# Patient Record
Sex: Female | Born: 1950 | Race: Black or African American | Hispanic: No | Smoking: Never smoker
Health system: Southern US, Community
[De-identification: ages and names within clinical notes are randomized; demographics above are authoritative.]

## PROBLEM LIST (undated history)

## (undated) DIAGNOSIS — K439 Ventral hernia without obstruction or gangrene: Secondary | ICD-10-CM

## (undated) DIAGNOSIS — E669 Obesity, unspecified: Secondary | ICD-10-CM

## (undated) DIAGNOSIS — E7800 Pure hypercholesterolemia, unspecified: Secondary | ICD-10-CM

## (undated) HISTORY — PX: CHOLECYSTECTOMY: SHX55

---

## 2003-10-11 ENCOUNTER — Emergency Department (HOSPITAL_COMMUNITY): Admission: EM | Admit: 2003-10-11 | Discharge: 2003-10-12 | Payer: Self-pay | Admitting: Emergency Medicine

## 2003-10-17 ENCOUNTER — Ambulatory Visit (HOSPITAL_BASED_OUTPATIENT_CLINIC_OR_DEPARTMENT_OTHER): Admission: RE | Admit: 2003-10-17 | Discharge: 2003-10-17 | Payer: Self-pay | Admitting: Orthopedic Surgery

## 2013-03-03 ENCOUNTER — Encounter (HOSPITAL_BASED_OUTPATIENT_CLINIC_OR_DEPARTMENT_OTHER): Payer: Self-pay | Admitting: *Deleted

## 2013-03-03 ENCOUNTER — Emergency Department (HOSPITAL_BASED_OUTPATIENT_CLINIC_OR_DEPARTMENT_OTHER)
Admission: EM | Admit: 2013-03-03 | Discharge: 2013-03-03 | Disposition: A | Discharge status: Home / Self Care | Payer: Federal, State, Local not specified - PPO | Attending: Emergency Medicine | Admitting: Emergency Medicine

## 2013-03-03 DIAGNOSIS — H109 Unspecified conjunctivitis: Secondary | ICD-10-CM | POA: Insufficient documentation

## 2013-03-03 MED ORDER — TETRACAINE HCL 0.5 % OP SOLN
OPHTHALMIC | Status: AC
  Administered 2013-03-03: 2 [drp]
  Filled 2013-03-03: qty 2

## 2013-03-03 MED ORDER — FLUORESCEIN SODIUM 1 MG OP STRP
ORAL_STRIP | OPHTHALMIC | Status: AC
  Administered 2013-03-03: 17:00:00
  Filled 2013-03-03: qty 1

## 2013-03-03 MED ORDER — OFLOXACIN 0.3 % OP SOLN: 1.0000 [drp] | Freq: Four times a day (QID) | OPHTHALMIC | Status: DC

## 2013-03-03 NOTE — ED Notes (Signed)
Left eye pain. States she may have scratched her eye.

## 2013-03-03 NOTE — ED Provider Notes (Signed)
   History    CSN: [id number] Arrival date & time 03/03/13  1528  First MD Initiated Contact with Patient 03/03/13 1601     Chief Complaint  Patient presents with  . Eye Pain   (Consider location/radiation/quality/duration/timing/severity/associated sxs/prior Treatment) HPI Comments: Patient a contact lens wearer.  Took her contacts out last night and unsure if she may have scratched it as it has burning since.    Patient is a 62 y.o. female presenting with eye pain. The history is provided by the patient.  Eye Pain This is a new problem. The current episode started yesterday. The problem occurs constantly. The problem has been gradually worsening. Pertinent negatives include no headaches. Nothing aggravates the symptoms. Nothing relieves the symptoms. She has tried nothing for the symptoms. The treatment provided no relief.   History reviewed. No pertinent past medical history. History reviewed. No pertinent past surgical history. No family history on file. History  Substance Use Topics  . Smoking status: Never Smoker   . Smokeless tobacco: Not on file  . Alcohol Use: No   OB History   Grav Para Term Preterm Abortions TAB SAB Ect Mult Living                 Review of Systems  Eyes: Positive for pain.  Neurological: Negative for headaches.  All other systems reviewed and are negative.    Allergies  Review of patient's allergies indicates no known allergies.  Home Medications  No current outpatient prescriptions on file. BP 150/83  Pulse 70  Temp(Src) 98.3 F (36.8 C) (Oral)  Resp 20  Ht 5\' 4"  (1.626 m)  Wt 240 lb (108.863 kg)  BMI 41.18 kg/m2  SpO2 96% Physical Exam  Nursing note and vitals reviewed. Constitutional: She is oriented to person, place, and time. She appears well-developed and well-nourished. No distress.  HENT:  Head: Normocephalic and atraumatic.  Eyes: Pupils are equal, round, and reactive to light.  The left conjunctiva is injected with clear  discharge.  There is no obvious corneal abrasion or ulcer to direct visualization or with fluorescein staining.    Neck: Normal range of motion. Neck supple.  Neurological: She is alert and oriented to person, place, and time.  Skin: Skin is warm and dry. She is not diaphoretic.    ED Course  Procedures (including critical care time) Labs Reviewed - No data to display No results found. No diagnosis found.  MDM  Will treat with ofloxin for contact lens conjunctivitis.  Return prn.  Geoffery Lyons, MD 03/03/13 [phone]

## 2013-07-03 ENCOUNTER — Other Ambulatory Visit: Payer: Self-pay

## 2013-07-03 DIAGNOSIS — Z1231 Encounter for screening mammogram for malignant neoplasm of breast: Secondary | ICD-10-CM

## 2013-07-31 ENCOUNTER — Ambulatory Visit: Payer: Federal, State, Local not specified - PPO

## 2013-08-03 ENCOUNTER — Emergency Department (HOSPITAL_BASED_OUTPATIENT_CLINIC_OR_DEPARTMENT_OTHER)
Admission: EM | Admit: 2013-08-03 | Discharge: 2013-08-03 | Disposition: A | Discharge status: Home / Self Care | Payer: Federal, State, Local not specified - PPO | Attending: Emergency Medicine | Admitting: Emergency Medicine

## 2013-08-03 ENCOUNTER — Emergency Department (HOSPITAL_BASED_OUTPATIENT_CLINIC_OR_DEPARTMENT_OTHER): Payer: Federal, State, Local not specified - PPO

## 2013-08-03 ENCOUNTER — Encounter (HOSPITAL_BASED_OUTPATIENT_CLINIC_OR_DEPARTMENT_OTHER): Payer: Self-pay | Admitting: Emergency Medicine

## 2013-08-03 DIAGNOSIS — J069 Acute upper respiratory infection, unspecified: Secondary | ICD-10-CM

## 2013-08-03 DIAGNOSIS — J029 Acute pharyngitis, unspecified: Secondary | ICD-10-CM | POA: Insufficient documentation

## 2013-08-03 DIAGNOSIS — R509 Fever, unspecified: Secondary | ICD-10-CM | POA: Insufficient documentation

## 2013-08-03 DIAGNOSIS — R51 Headache: Secondary | ICD-10-CM | POA: Insufficient documentation

## 2013-08-03 MED ORDER — IBUPROFEN 400 MG PO TABS
600.0000 mg | ORAL_TABLET | Freq: Once | ORAL | Status: AC
  Administered 2013-08-03: 600 mg via ORAL
  Filled 2013-08-03 (×2): qty 1

## 2013-08-03 MED ORDER — ALBUTEROL SULFATE (5 MG/ML) 0.5% IN NEBU
5.0000 mg | INHALATION_SOLUTION | Freq: Once | RESPIRATORY_TRACT | Status: AC
  Administered 2013-08-03: 5 mg via RESPIRATORY_TRACT
  Filled 2013-08-03: qty 1

## 2013-08-03 MED ORDER — ALBUTEROL SULFATE HFA 108 (90 BASE) MCG/ACT IN AERS: 2.0000 | INHALATION_SPRAY | RESPIRATORY_TRACT | Status: DC | PRN

## 2013-08-03 MED ORDER — OXYMETAZOLINE HCL 0.05 % NA SOLN
2.0000 | Freq: Once | NASAL | Status: AC
  Administered 2013-08-03: 2 via NASAL
  Filled 2013-08-03: qty 15

## 2013-08-03 NOTE — ED Notes (Signed)
Pt c/o sneezing runny nose, ha, congestion, wheezing, sorethroat cough onset 2 days ago

## 2013-08-03 NOTE — ED Provider Notes (Signed)
TIME SEEN: 8:31 PM This chart was scribed for Layla Maw Ward, DO by Clydene Laming, ED Scribe. This patient was seen in room MH01/MH01 and the patient's care was started at 8:31 PM. CHIEF COMPLAINT: Cough, Chills  HPI:  HPI Comments: Angela Sampson is a 62 y.o. female no significant past medical history who presents to the Emergency Department complaining of a fever with an associated productive cough, chills, mild diffuse headache, sore throat, rhinorrhea, and wheezing onset yesterday. Pt denies vomiting or diarrhea. Pt has had a flu shot this year. She states some of her co-workers have recently been ill with flu like symptoms. Pt denies any foreign traveling. Pt does not have a hx of asthma or COPD or tobacco use.   ROS: See HPI Constitutional:  fever  Eyes: no drainage  ENT: no runny nose   Cardiovascular:  no chest pain  Resp: no SOB GI: no vomiting GU: no dysuria Integumentary: no rash  Allergy: no hives  Musculoskeletal: no leg swelling  Neurological: no slurred speech ROS otherwise negative  PAST MEDICAL HISTORY/PAST SURGICAL HISTORY:  History reviewed. No pertinent past medical history.  MEDICATIONS:  Prior to Admission medications   Medication Sig Start Date End Date Taking? Authorizing Provider  ofloxacin (OCUFLOX) 0.3 % ophthalmic solution Place 1 drop into the left eye 4 (four) times daily. 03/03/13   Geoffery Lyons, MD    ALLERGIES:  No Known Allergies  SOCIAL HISTORY:  History  Substance Use Topics  . Smoking status: Never Smoker   . Smokeless tobacco: Not on file  . Alcohol Use: No    FAMILY HISTORY: No family history on file.  EXAM: Triage Vitals:BP 149/68  Pulse 83  Temp(Src) 98.5 F (36.9 C) (Oral)  Resp 18  Ht 5\' 4"  (1.626 m)  Wt 240 lb (108.863 kg)  BMI 41.18 kg/m2  SpO2 96% CONSTITUTIONAL: Alert and oriented and responds appropriately to questions. Well-appearing; well-nourished HEAD: Normocephalic EYES: Conjunctivae clear, PERRL ENT:  normal nose; no rhinorrhea; moist mucous membranes; mild pharyngeal erythema without tonsillar hypertrophy or exudate NECK: Supple, no meningismus, no LAD  CARD: RRR; S1 and S2 appreciated; no murmurs, no clicks, no rubs, no gallops RESP: Normal chest excursion without splinting or tachypnea; breath sounds equal bilaterally with mild expiratory wheeze, no rhonchi or rales ABD/GI: Normal bowel sounds; non-distended; soft, non-tender, no rebound, no guarding BACK:  The back appears normal and is non-tender to palpation, there is no CVA tenderness EXT: Normal ROM in all joints; non-tender to palpation; no edema; normal capillary refill; no cyanosis    SKIN: Normal color for age and race; warm NEURO: Moves all extremities equally PSYCH: The patient's mood and manner are appropriate. Grooming and personal hygiene are appropriate.  MEDICAL DECISION MAKING: Patient here with likely viral symptoms. She is hemodynamically stable. She has mild expiratory wheeze. We'll give albuterol and obtain chest x-ray. Anticipate discharge home.  ED PROGRESS: Chest x-ray shows no infiltrate. Patient reports feeling much better after albuterol and aspirin and ibuprofen. We'll discharge home with supportive care instructions, prescription for albuterol and return precautions. Discussed with patient that I feel this is likely a virus and antiemetics operative time. She has a primary care physician for followup. She verbalizes understanding and is comfortable plan.    Layla Maw Ward, DO 08/03/13 2141

## 2013-08-03 NOTE — ED Notes (Signed)
Patient transported to X-ray 

## 2013-08-03 NOTE — ED Notes (Signed)
Fever, chills, headache sore throat runny nose, and wheezing since yesterday.

## 2013-08-30 ENCOUNTER — Ambulatory Visit: Payer: Federal, State, Local not specified - PPO

## 2014-07-10 IMAGING — CR DG CHEST 2V
2 series · 2 of 2 positions shown · non-contrast
Comparison: None

CLINICAL DATA: Chills, cough, wheezing, question infiltrate

EXAM:
CHEST  2 VIEW

[w chest pa]
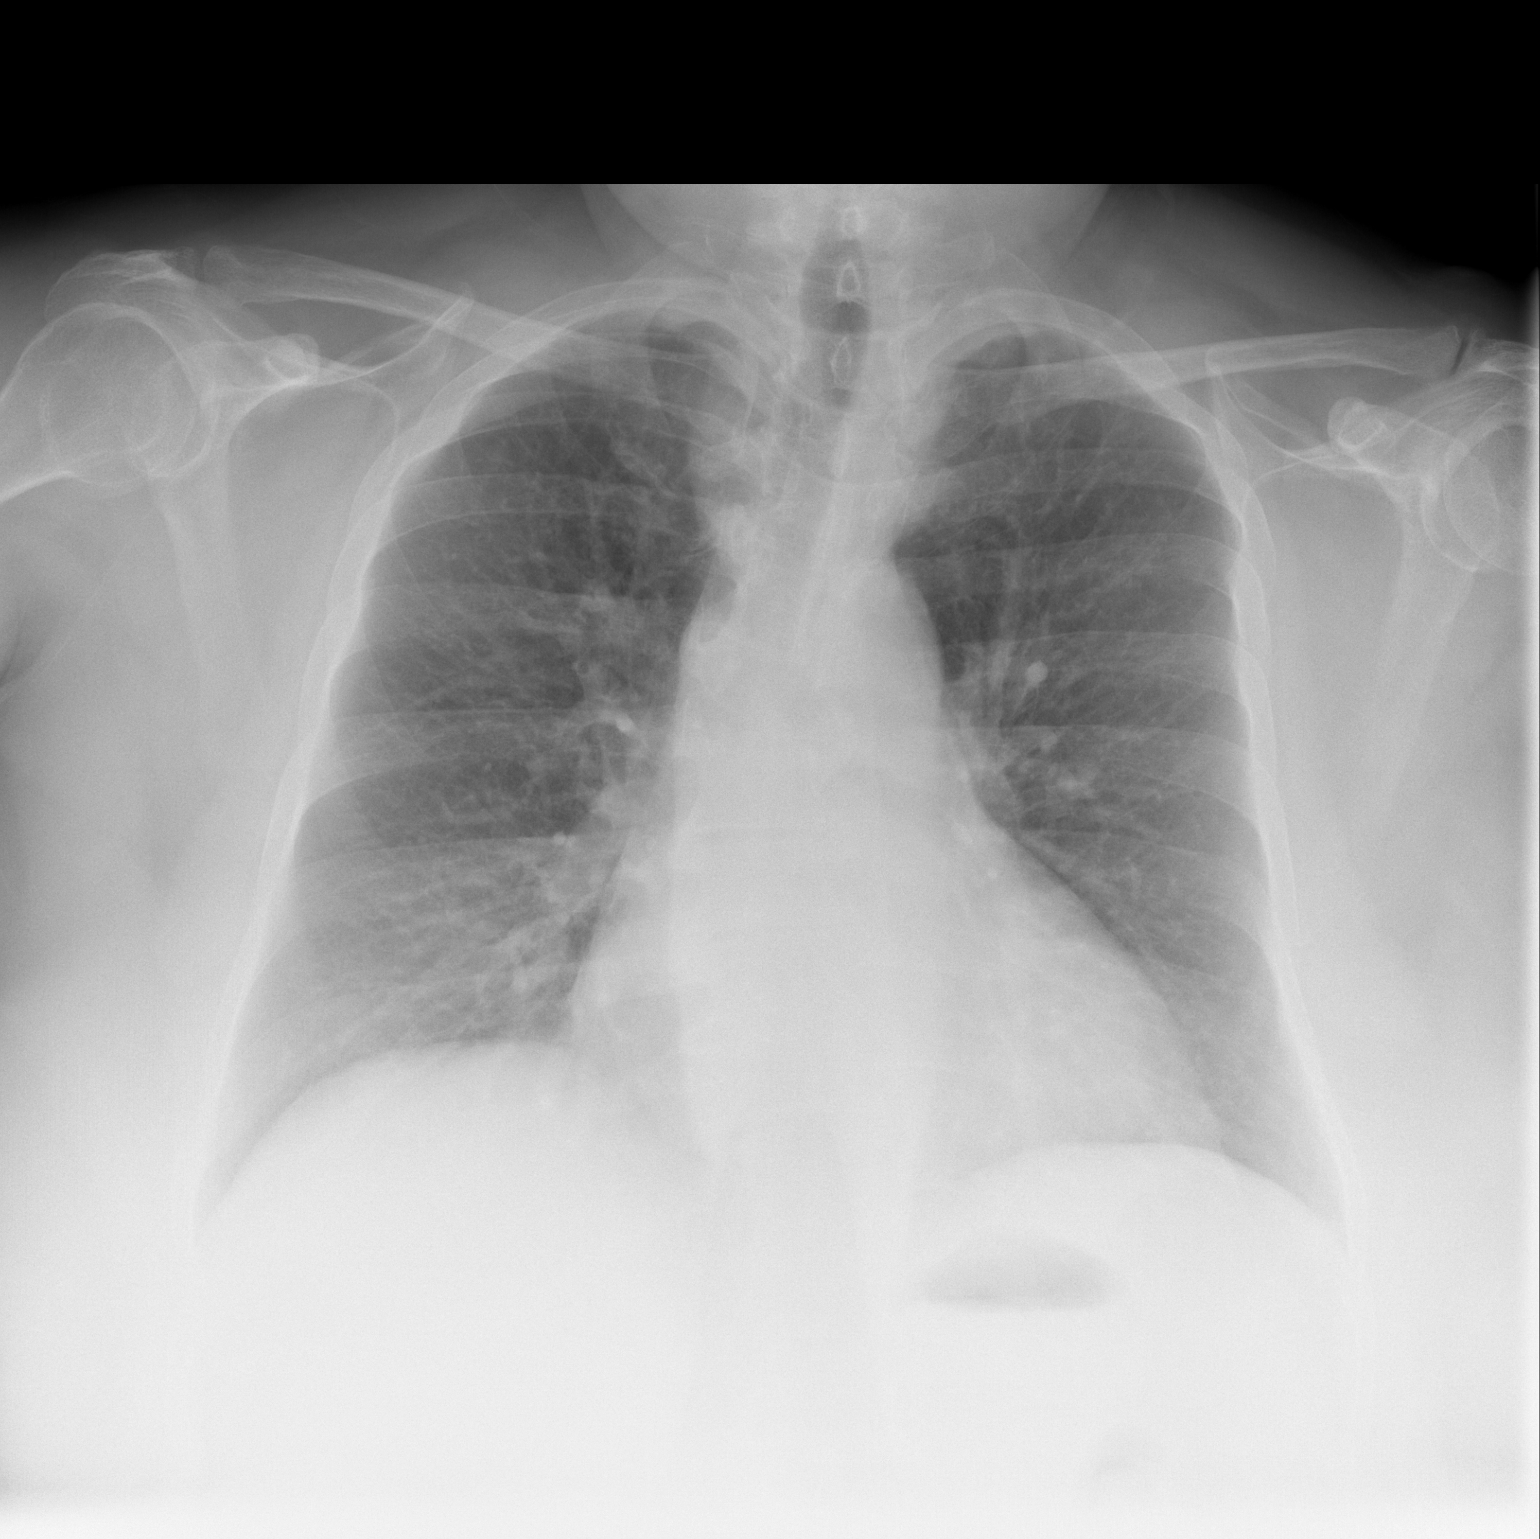

[w chest lat]
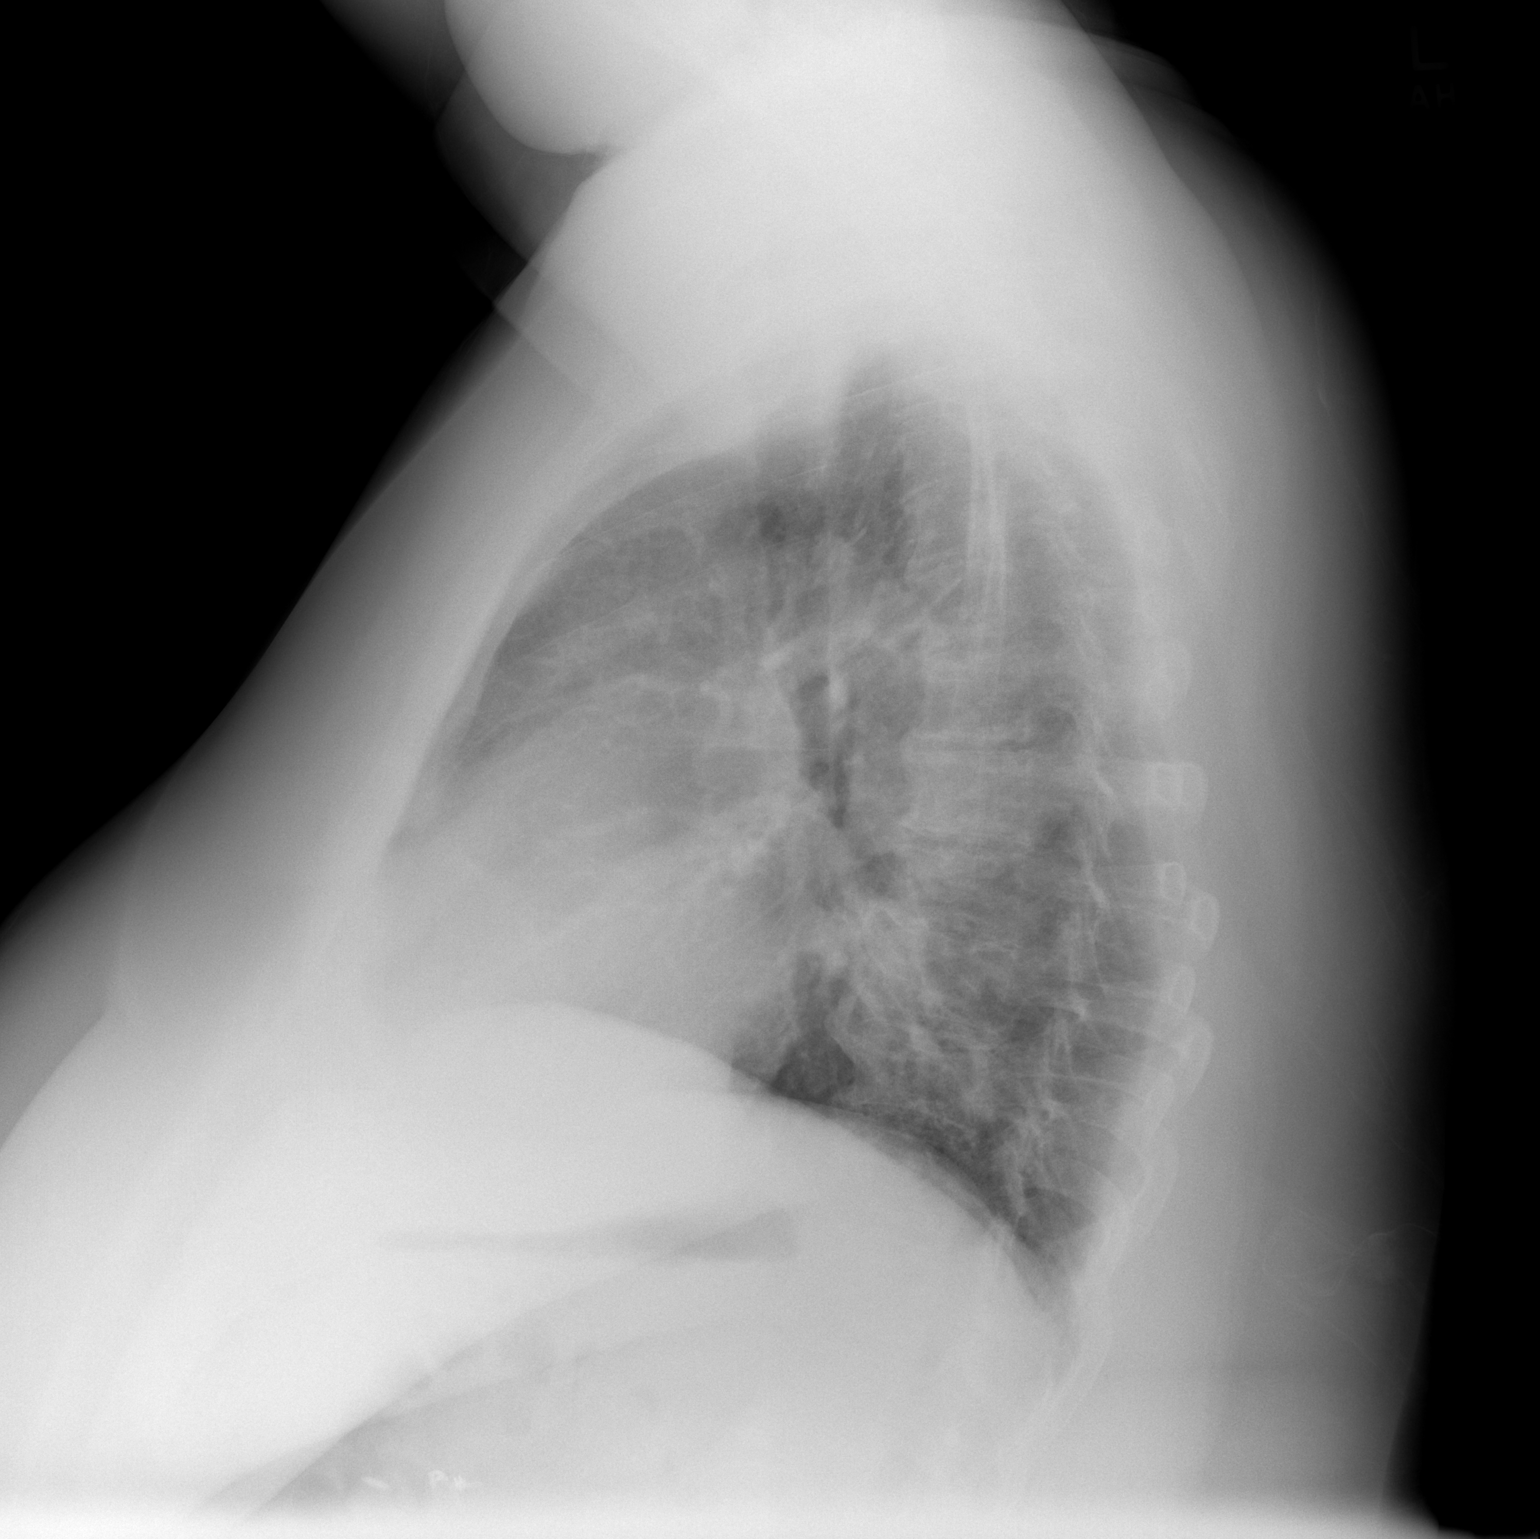

[2 of 2 positions shown; findings below may reference images not displayed]

FINDINGS: Enlargement of cardiac silhouette.

Slight pulmonary vascular congestion.

Mediastinal contours normal.

Lungs grossly clear.

No pleural effusion or pneumothorax.

Scattered endplate spur formation thoracic spine.
IMPRESSION: Enlargement of cardiac silhouette.

No acute abnormalities.

## 2016-02-07 ENCOUNTER — Emergency Department (HOSPITAL_BASED_OUTPATIENT_CLINIC_OR_DEPARTMENT_OTHER): Payer: Federal, State, Local not specified - PPO

## 2016-02-07 ENCOUNTER — Encounter (HOSPITAL_BASED_OUTPATIENT_CLINIC_OR_DEPARTMENT_OTHER): Payer: Self-pay | Admitting: Emergency Medicine

## 2016-02-07 ENCOUNTER — Emergency Department (HOSPITAL_BASED_OUTPATIENT_CLINIC_OR_DEPARTMENT_OTHER)
Admission: EM | Admit: 2016-02-07 | Discharge: 2016-02-07 | Disposition: A | Discharge status: Home / Self Care | Payer: Federal, State, Local not specified - PPO | Attending: Emergency Medicine | Admitting: Emergency Medicine

## 2016-02-07 DIAGNOSIS — Y929 Unspecified place or not applicable: Secondary | ICD-10-CM | POA: Insufficient documentation

## 2016-02-07 DIAGNOSIS — Y9389 Activity, other specified: Secondary | ICD-10-CM | POA: Insufficient documentation

## 2016-02-07 DIAGNOSIS — X58XXXA Exposure to other specified factors, initial encounter: Secondary | ICD-10-CM | POA: Diagnosis not present

## 2016-02-07 DIAGNOSIS — S2232XA Fracture of one rib, left side, initial encounter for closed fracture: Secondary | ICD-10-CM | POA: Diagnosis not present

## 2016-02-07 DIAGNOSIS — S299XXA Unspecified injury of thorax, initial encounter: Secondary | ICD-10-CM | POA: Diagnosis present

## 2016-02-07 DIAGNOSIS — Y999 Unspecified external cause status: Secondary | ICD-10-CM | POA: Diagnosis not present

## 2016-02-07 HISTORY — DX: Ventral hernia without obstruction or gangrene: K43.9

## 2016-02-07 HISTORY — DX: Obesity, unspecified: E66.9

## 2016-02-07 HISTORY — DX: Pure hypercholesterolemia, unspecified: E78.00

## 2016-02-07 MED ORDER — HYDROCODONE-ACETAMINOPHEN 5-325 MG PO TABS: 1.0000 | ORAL_TABLET | Freq: Four times a day (QID) | ORAL | Status: AC | PRN

## 2016-02-07 NOTE — Discharge Instructions (Signed)

## 2016-02-07 NOTE — ED Provider Notes (Signed)
CSN: [id number]     Arrival date & time 02/07/16  0010 History   First MD Initiated Contact with Patient 02/07/16 0055     Chief Complaint  Patient presents with  . Rib Pain      (Consider location/radiation/quality/duration/timing/severity/associated sxs/prior Treatment) HPI  This is a 65 year old female who has had a pain in her left side which she describes as feeling like I am pulled muscle. Yesterday evening about 9 PM she sneezed and experienced a sudden onset, severe pain in her left lower rib area that she describes as feeling like bone-on-bone. She is having moderate to severe pain at that site, worse with deep breaths, coughing or sneezing. She is not short of breath.  Past Medical History  Diagnosis Date  . High cholesterol   . Obesity   . Hernia of abdominal wall    Past Surgical History  Procedure Laterality Date  . Cholecystectomy     No family history on file. Social History  Substance Use Topics  . Smoking status: Never Smoker   . Smokeless tobacco: None  . Alcohol Use: No   OB History    No data available     Review of Systems  All other systems reviewed and are negative.   Allergies  Review of patient's allergies indicates no known allergies.  Home Medications   Prior to Admission medications   Medication Sig Start Date End Date Taking? Authorizing Provider  PRESCRIPTION MEDICATION Takes a statin drug but does not know the name.   Yes Historical Provider, MD  albuterol (PROVENTIL HFA;VENTOLIN HFA) 108 (90 BASE) MCG/ACT inhaler Inhale 2 puffs into the lungs every 4 (four) hours as needed for wheezing or shortness of breath. 08/03/13   Kristen N Ward, DO  ofloxacin (OCUFLOX) 0.3 % ophthalmic solution Place 1 drop into the left eye 4 (four) times daily. 03/03/13   Geoffery Lyonsouglas Delo, MD   BP 166/98 mmHg  Pulse 80  Temp(Src) 99.1 F (37.3 C) (Oral)  Resp 20  Ht 5\' 2"  (1.575 m)  Wt 240 lb (108.863 kg)  BMI 43.89 kg/m2  SpO2 96%   Physical Exam General:  Well-developed, well-nourished female in no acute distress; appearance consistent with age of record HENT: normocephalic; atraumatic Eyes: pupils equal, round and reactive to light; extraocular muscles intact Neck: supple Heart: regular rate and rhythm Lungs: clear to auscultation bilaterally Chest: Left lower rib tenderness at about the mid axillary line without palpable crepitus Abdomen: soft; obese; nontender Extremities: No deformity; full range of motion Neurologic: Awake, alert and oriented; motor function intact in all extremities and symmetric; no facial droop Skin: Warm and dry Psychiatric: Normal mood and affect    ED Course  Procedures (including critical care time)   MDM  Nursing notes and vitals signs, including pulse oximetry, reviewed.  Summary of this visit's results, reviewed by myself:  Imaging Studies: Dg Chest 2 View  02/07/2016  CLINICAL DATA:  Left rib pain after sneezing tonight. Initial encounter. EXAM: CHEST  2 VIEW COMPARISON:  08/03/2013 FINDINGS: New mildly displaced left posterior eighth rib fracture. No effusion or pneumothorax. Normal heart size and mediastinal contours. Spondylosis and dextroscoliosis. IMPRESSION: Mildly displaced left eighth rib fracture. Negative for pneumothorax. Electronically Signed   By: Marnee SpringJonathon  Watts M.D.   On: 02/07/2016 01:03      Paula LibraJohn Rigby Leonhardt, MD 02/07/16 [phone]

## 2016-02-07 NOTE — ED Notes (Signed)
Patient transported to X-ray 

## 2016-02-07 NOTE — ED Notes (Signed)
Pt sts she sneezed tonight at about 9pm and felt an instant pain in her left rib area below axilla.

## 2016-02-07 NOTE — ED Notes (Signed)
Pt teaching provided on medications that may cause drowsiness. Pt instructed not to drive or operate heavy machinery while taking the prescribed medication. Pt verbalized understanding.   

## 2017-01-13 IMAGING — CR DG CHEST 2V
2 series · 2 of 2 positions shown · non-contrast
Comparison: 08/03/2013

CLINICAL DATA: Left rib pain after sneezing tonight. Initial
encounter.

EXAM:
CHEST  2 VIEW

[w chest pa]
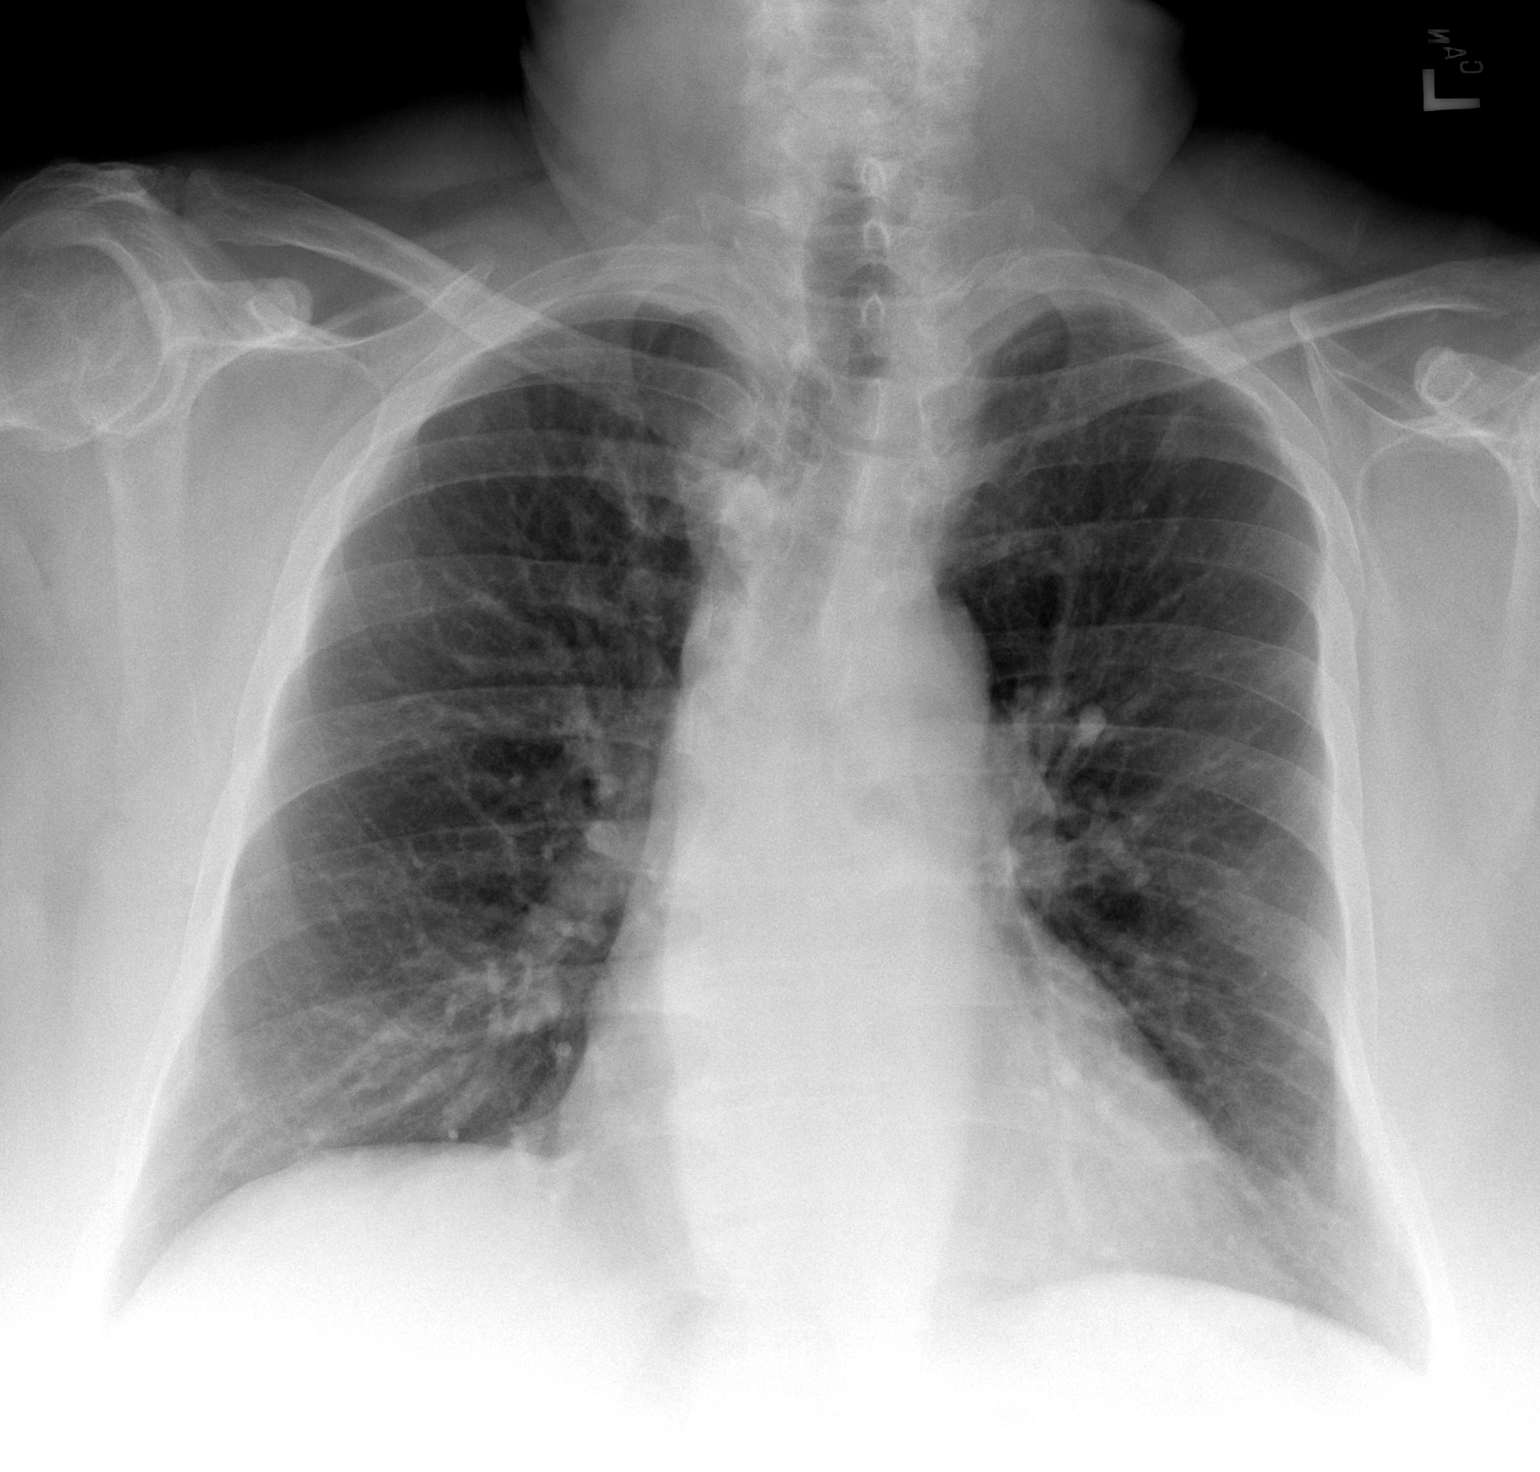

[w chest lat]
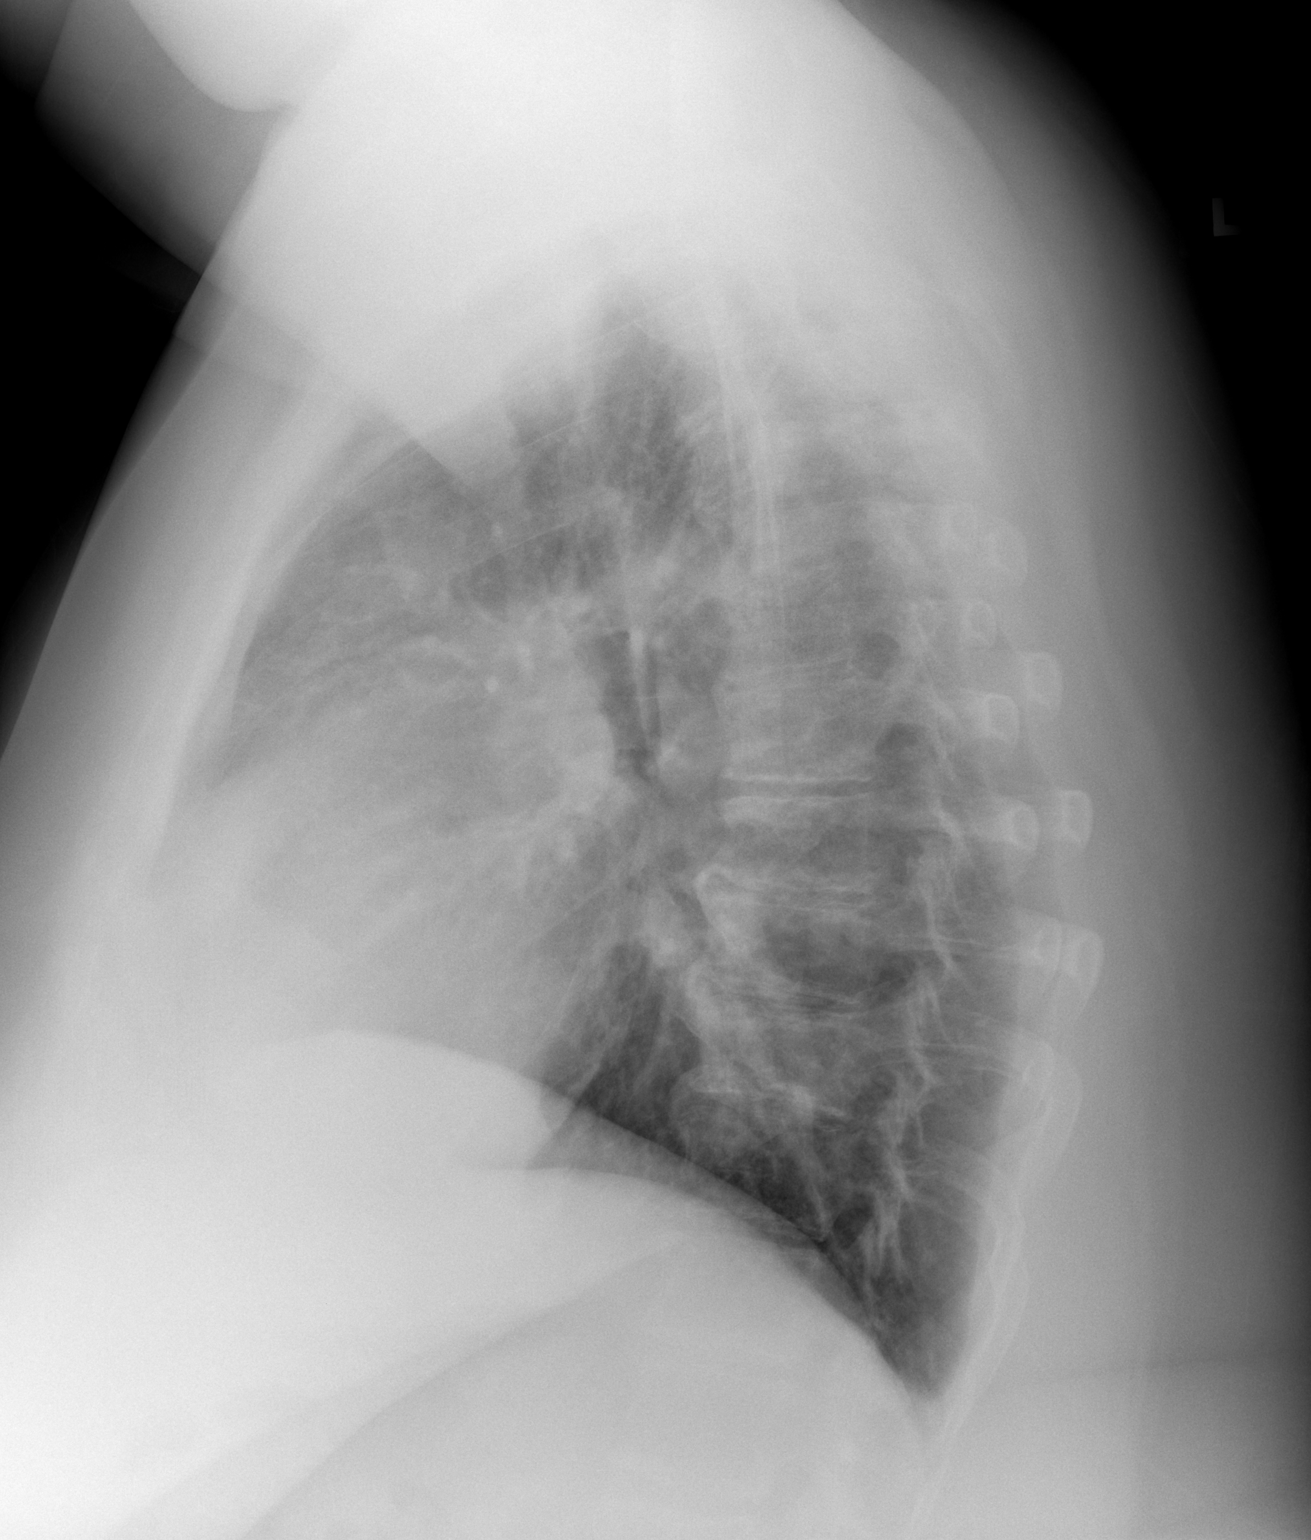

[2 of 2 positions shown; findings below may reference images not displayed]

FINDINGS: New mildly displaced left posterior eighth rib fracture. No effusion
or pneumothorax. Normal heart size and mediastinal contours.
Spondylosis and dextroscoliosis.
IMPRESSION: Mildly displaced left eighth rib fracture. Negative for
pneumothorax.
